# Patient Record
Sex: Male | Born: 1990 | Race: White | Hispanic: No | Marital: Single | Smoking: Current every day smoker
Health system: Southern US, Community
[De-identification: ages and names within clinical notes are randomized; demographics above are authoritative.]

---
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
## 2012-07-06 ENCOUNTER — Emergency Department: Payer: Self-pay | Admitting: Emergency Medicine

## 2012-10-31 ENCOUNTER — Emergency Department: Payer: Self-pay | Admitting: Emergency Medicine

## 2013-04-29 ENCOUNTER — Emergency Department: Payer: Self-pay | Admitting: Internal Medicine

## 2013-05-01 ENCOUNTER — Emergency Department: Payer: Self-pay | Admitting: Emergency Medicine

## 2013-08-15 IMAGING — CR DG CHEST 2V
1 series · 2 of 2 positions shown · non-contrast
Comparison: none

REASON FOR EXAM: chest pain
COMMENTS:   May transport without cardiac monitor

PROCEDURE:     DXR - DXR CHEST PA (OR AP) AND LATERAL  - October 17, 2011  [DATE]
RESULT:     Comparison: None

[Series 1: pa · 0.17mm/px · 2 of 2 slices shown]
[im 1/2]
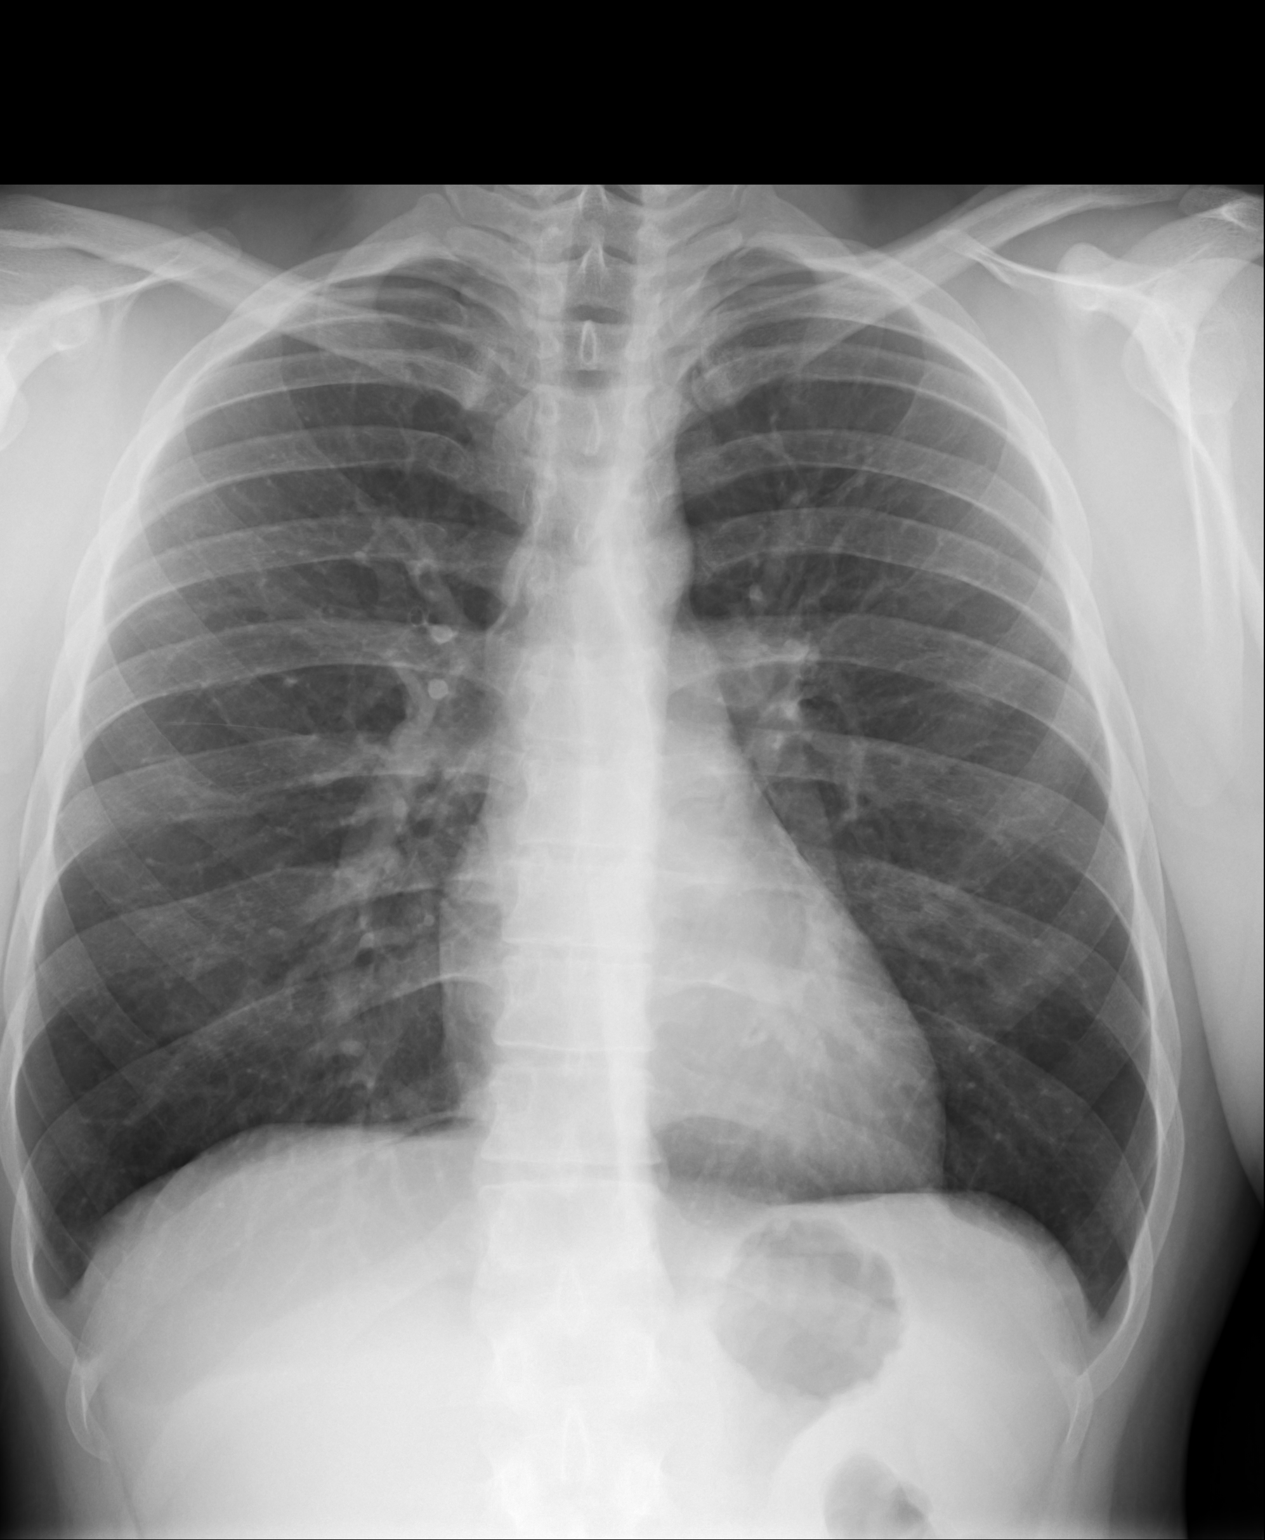
[im 2/2]
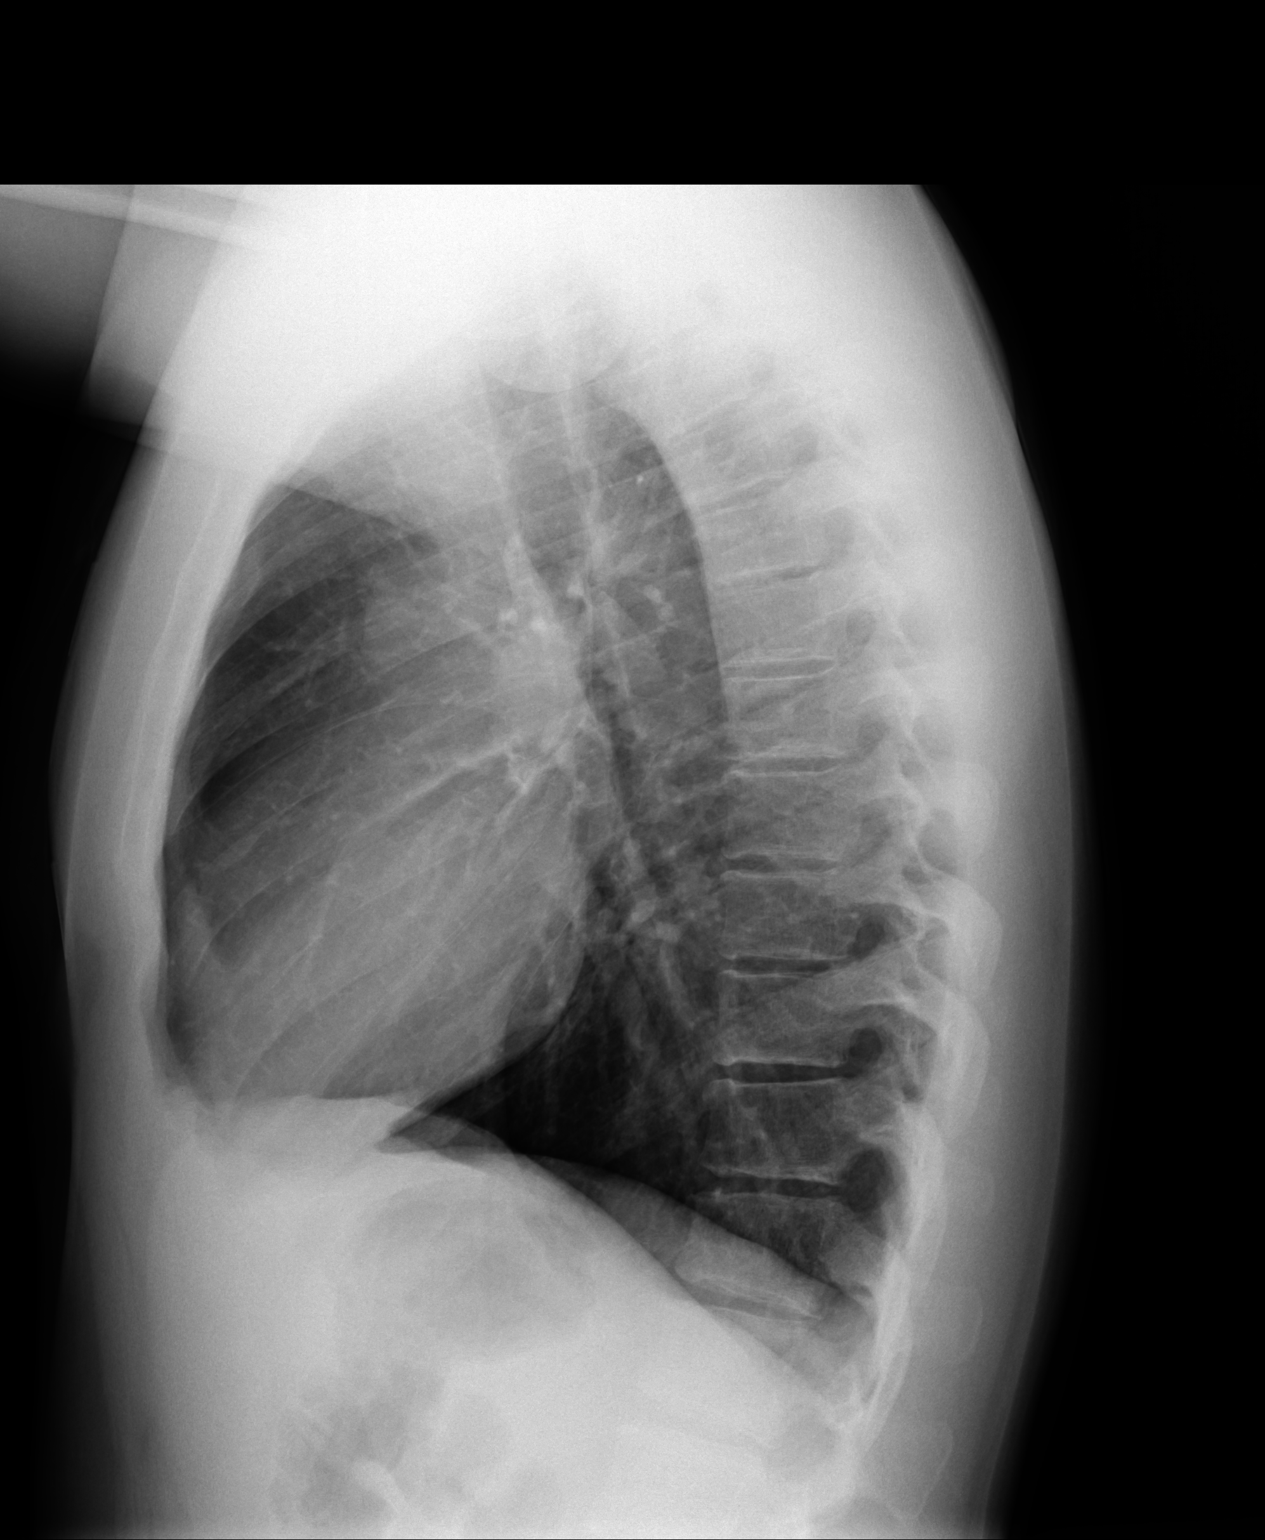

[2 of 2 positions shown; findings below may reference images not displayed]

FINDINGS: PA and lateral chest radiographs are provided.  There is no focal
parenchymal opacity, pleural effusion, or pneumothorax. The heart and
mediastinum are unremarkable.  The osseous structures are unremarkable.
IMPRESSION: No acute disease of the che[REDACTED]

## 2014-02-17 ENCOUNTER — Emergency Department (HOSPITAL_COMMUNITY)
Admission: EM | Admit: 2014-02-17 | Discharge: 2014-02-17 | Disposition: A | Discharge status: Home / Self Care | Payer: Medicaid Other | Attending: Emergency Medicine | Admitting: Emergency Medicine

## 2014-02-17 ENCOUNTER — Encounter (HOSPITAL_COMMUNITY): Payer: Self-pay | Admitting: Cardiology

## 2014-02-17 DIAGNOSIS — Z720 Tobacco use: Secondary | ICD-10-CM | POA: Insufficient documentation

## 2014-02-17 DIAGNOSIS — K0889 Other specified disorders of teeth and supporting structures: Secondary | ICD-10-CM

## 2014-02-17 DIAGNOSIS — K088 Other specified disorders of teeth and supporting structures: Secondary | ICD-10-CM | POA: Diagnosis not present

## 2014-02-17 MED ORDER — HYDROCODONE-ACETAMINOPHEN 5-325 MG PO TABS: 1.0000 | ORAL_TABLET | Freq: Once | ORAL | Status: DC

## 2014-02-17 MED ORDER — IBUPROFEN 600 MG PO TABS: 600.0000 mg | ORAL_TABLET | Freq: Four times a day (QID) | ORAL | Status: AC | PRN

## 2014-02-17 MED ORDER — AMOXICILLIN 500 MG PO CAPS: 500.0000 mg | ORAL_CAPSULE | Freq: Three times a day (TID) | ORAL | Status: AC

## 2014-02-17 MED ORDER — HYDROCODONE-ACETAMINOPHEN 5-325 MG PO TABS: 1.0000 | ORAL_TABLET | Freq: Four times a day (QID) | ORAL | Status: AC | PRN

## 2014-02-17 MED ORDER — OXYCODONE-ACETAMINOPHEN 5-325 MG PO TABS
1.0000 | ORAL_TABLET | Freq: Once | ORAL | Status: AC
  Administered 2014-02-17: 1 via ORAL
  Filled 2014-02-17: qty 1

## 2014-02-17 NOTE — Discharge Instructions (Signed)
Ibuprofen for pain, norco for severe pain. Follow up with a dentist as referred.    Dental Pain A tooth ache may be caused by cavities (tooth decay). Cavities expose the nerve of the tooth to air and hot or cold temperatures. It may come from an infection or abscess (also called a boil or furuncle) around your tooth. It is also often caused by dental caries (tooth decay). This causes the pain you are having. DIAGNOSIS  Your caregiver can diagnose this problem by exam. TREATMENT   If caused by an infection, it may be treated with medications which kill germs (antibiotics) and pain medications as prescribed by your caregiver. Take medications as directed.  Only take over-the-counter or prescription medicines for pain, discomfort, or fever as directed by your caregiver.  Whether the tooth ache today is caused by infection or dental disease, you should see your dentist as soon as possible for further care. SEEK MEDICAL CARE IF: The exam and treatment you received today has been provided on an emergency basis only. This is not a substitute for complete medical or dental care. If your problem worsens or new problems (symptoms) appear, and you are unable to meet with your dentist, call or return to this location. SEEK IMMEDIATE MEDICAL CARE IF:   You have a fever.  You develop redness and swelling of your face, jaw, or neck.  You are unable to open your mouth.  You have severe pain uncontrolled by pain medicine. MAKE SURE YOU:   Understand these instructions.  Will watch your condition.  Will get help right away if you are not doing well or get worse. Document Released: 03/30/2005 Document Revised: 06/22/2011 Document Reviewed: 11/16/2007 [facility] Patient Information 2015 [place], [place]. This information is not intended to replace advice given to you by your health care provider. Make sure you discuss any questions you have with your health care provider.

## 2014-02-17 NOTE — ED Notes (Signed)
Pt reports right sided tooth pain for the past week. Reports he thinks he needs his wisdom teeth removed.

## 2014-02-17 NOTE — ED Provider Notes (Signed)
CSN: [id number]     Arrival date & time 02/17/14  1626 History  This chart was scribed for non-physician practitioner, Jaynie Crumbleatyana Shontavia Mickel, PA-C,working with Gerhard Munchobert Lockwood, MD, by Karle PlumberJennifer Tensley, ED Scribe. This patient was seen in room TR11C/TR11C and the patient's care was started at 5:13 PM.  Chief Complaint  Patient presents with  . Dental Pain   Patient is a 23 y.o. male presenting with tooth pain. The history is provided by the patient. No language interpreter was used.  Dental Pain Associated symptoms: no facial swelling and no fever     HPI Comments:  Albert Torres is a 23 y.o. male who presents to the Emergency Department complaining of severe lower right-sided dental pain that began about one week ago. He reports associated swelling of the surrounding gingiva. Pt states he has applied Orajel to the area with minimal relief of the pain. Eating makes the pain worse. Denies alleviating factors. Denies fever, chills, nausea, vomiting, facial swelling, difficulty swallowing, sore throat or otalgia. He does not have a dentist.  History reviewed. No pertinent past medical history. History reviewed. No pertinent past surgical history. History reviewed. No pertinent family history. History  Substance Use Topics  . Smoking status: Current Every Day Smoker  . Smokeless tobacco: Not on file  . Alcohol Use: Yes    Review of Systems  Constitutional: Negative for fever and chills.  HENT: Positive for dental problem. Negative for ear pain, facial swelling, sore throat and trouble swallowing.   Gastrointestinal: Negative for nausea and vomiting.    Allergies  Review of patient's allergies indicates no known allergies.  Home Medications   Prior to Admission medications   Not on File   Triage Vitals: BP 159/85 mmHg  Pulse 67  Temp(Src) 98.1 F (36.7 C)  Resp 16  SpO2 99% Physical Exam  Constitutional: He is oriented to person, place, and time. He appears well-developed and  well-nourished.  HENT:  Head: Normocephalic and atraumatic.  Good dentition, no obvious caries or abscesses right lower third molar erupting from under the skin, no surrounding gum inflammation. Tooth is tender to palpation. No swelling noted to tongue, no trismus, no facial swelling  Eyes: EOM are normal.  Neck: Normal range of motion.  Cardiovascular: Normal rate.   Pulmonary/Chest: Effort normal.  Musculoskeletal: Normal range of motion.  Neurological: He is alert and oriented to person, place, and time.  Skin: Skin is warm and dry.  Psychiatric: He has a normal mood and affect. His behavior is normal.  Nursing note and vitals reviewed.   ED Course  Procedures (including critical care time) DIAGNOSTIC STUDIES: Oxygen Saturation is 99% on RA, normal by my interpretation.   COORDINATION OF CARE: 5:18 PM- Will prescribe pain medication and antibiotic. Will refer to dentist. Pt verbalizes understanding and agrees to plan.  Medications - No data to display  Labs Review Labs Reviewed - No data to display  Imaging Review No results found.   EKG Interpretation None      MDM   Final diagnoses:  Pain, dental   Patient with dental pain, pain is in his right lower third molar, which is erupting from under the skin. It does not appear to be obviously infected, however cannot rule out early infection. We'll start antibiotics, pain medications, follow up with a dentist. It is alluded to angina, no other complaints.  Filed Vitals:   02/17/14 1634  BP: 159/85  Pulse: 67  Temp: 98.1 F (36.7 C)  Resp: 16  SpO2: 99%     I personally performed the services described in this documentation, which was scribed in my presence. The recorded information has been reviewed and is accurate.    Lottie Musselatyana A Kyleeann Cremeans, PA-C 02/17/14 2150  Gerhard Munchobert Lockwood, MD 02/18/14 0003

## 2014-02-17 NOTE — ED Notes (Signed)
Declined W/C at D/C and was escorted to lobby by RN. 

## 2014-04-19 ENCOUNTER — Encounter (HOSPITAL_COMMUNITY): Payer: Self-pay | Admitting: Cardiology

## 2014-04-19 ENCOUNTER — Emergency Department (HOSPITAL_COMMUNITY)
Admission: EM | Admit: 2014-04-19 | Discharge: 2014-04-19 | Disposition: A | Discharge status: Home / Self Care | Payer: Medicaid Other | Attending: Emergency Medicine | Admitting: Emergency Medicine

## 2014-04-19 ENCOUNTER — Emergency Department (HOSPITAL_COMMUNITY): Payer: Medicaid Other

## 2014-04-19 DIAGNOSIS — Y998 Other external cause status: Secondary | ICD-10-CM | POA: Insufficient documentation

## 2014-04-19 DIAGNOSIS — Y9289 Other specified places as the place of occurrence of the external cause: Secondary | ICD-10-CM | POA: Diagnosis not present

## 2014-04-19 DIAGNOSIS — S4991XA Unspecified injury of right shoulder and upper arm, initial encounter: Secondary | ICD-10-CM | POA: Diagnosis present

## 2014-04-19 DIAGNOSIS — W010XXA Fall on same level from slipping, tripping and stumbling without subsequent striking against object, initial encounter: Secondary | ICD-10-CM | POA: Diagnosis not present

## 2014-04-19 DIAGNOSIS — Z720 Tobacco use: Secondary | ICD-10-CM | POA: Insufficient documentation

## 2014-04-19 DIAGNOSIS — Y9389 Activity, other specified: Secondary | ICD-10-CM | POA: Insufficient documentation

## 2014-04-19 DIAGNOSIS — I10 Essential (primary) hypertension: Secondary | ICD-10-CM

## 2014-04-19 DIAGNOSIS — Z792 Long term (current) use of antibiotics: Secondary | ICD-10-CM | POA: Insufficient documentation

## 2014-04-19 MED ORDER — OXYCODONE-ACETAMINOPHEN 5-325 MG PO TABS: 1.0000 | ORAL_TABLET | ORAL | Status: AC | PRN

## 2014-04-19 MED ORDER — NAPROXEN 500 MG PO TABS: 500.0000 mg | ORAL_TABLET | Freq: Two times a day (BID) | ORAL | Status: DC

## 2014-04-19 MED ORDER — OXYCODONE-ACETAMINOPHEN 5-325 MG PO TABS: 1.0000 | ORAL_TABLET | ORAL | Status: DC | PRN

## 2014-04-19 MED ORDER — OXYCODONE-ACETAMINOPHEN 5-325 MG PO TABS
2.0000 | ORAL_TABLET | Freq: Once | ORAL | Status: AC
  Administered 2014-04-19: 2 via ORAL
  Filled 2014-04-19: qty 2

## 2014-04-19 NOTE — Discharge Instructions (Signed)
Hypertension °Hypertension, commonly called high blood pressure, is when the force of blood pumping through your arteries is too strong. Your arteries are the blood vessels that carry blood from your heart throughout your body. A blood pressure reading consists of a higher number over a lower number, such as 110/72. The higher number (systolic) is the pressure inside your arteries when your heart pumps. The lower number (diastolic) is the pressure inside your arteries when your heart relaxes. Ideally you want your blood pressure below 120/80. °Hypertension forces your heart to work harder to pump blood. Your arteries may become narrow or stiff. Having hypertension puts you at risk for heart disease, stroke, and other problems.  °RISK FACTORS °Some risk factors for high blood pressure are controllable. Others are not.  °Risk factors you cannot control include:  °· Race. You may be at higher risk if you are African American. °· Age. Risk increases with age. °· Gender. Men are at higher risk than women before age 45 years. After age 65, women are at higher risk than men. °Risk factors you can control include: °· Not getting enough exercise or physical activity. °· Being overweight. °· Getting too much fat, sugar, calories, or salt in your diet. °· Drinking too much alcohol. °SIGNS AND SYMPTOMS °Hypertension does not usually cause signs or symptoms. Extremely high blood pressure (hypertensive crisis) may cause headache, anxiety, shortness of breath, and nosebleed. °DIAGNOSIS  °To check if you have hypertension, your health care provider will measure your blood pressure while you are seated, with your arm held at the level of your heart. It should be measured at least twice using the same arm. Certain conditions can cause a difference in blood pressure between your right and left arms. A blood pressure reading that is higher than normal on one occasion does not mean that you need treatment. If one blood pressure reading  is high, ask your health care provider about having it checked again. °TREATMENT  °Treating high blood pressure includes making lifestyle changes and possibly taking medicine. Living a healthy lifestyle can help lower high blood pressure. You may need to change some of your habits. °Lifestyle changes may include: °· Following the DASH diet. This diet is high in fruits, vegetables, and whole grains. It is low in salt, red meat, and added sugars. °· Getting at least 2½ hours of brisk physical activity every week. °· Losing weight if necessary. °· Not smoking. °· Limiting alcoholic beverages. °· Learning ways to reduce stress. ° If lifestyle changes are not enough to get your blood pressure under control, your health care provider may prescribe medicine. You may need to take more than one. Work closely with your health care provider to understand the risks and benefits. °HOME CARE INSTRUCTIONS °· Have your blood pressure rechecked as directed by your health care provider.   °· Take medicines only as directed by your health care provider. Follow the directions carefully. Blood pressure medicines must be taken as prescribed. The medicine does not work as well when you skip doses. Skipping doses also puts you at risk for problems.   °· Do not smoke.   °· Monitor your blood pressure at home as directed by your health care provider.  °SEEK MEDICAL CARE IF:  °· You think you are having a reaction to medicines taken. °· You have recurrent headaches or feel dizzy. °· You have swelling in your ankles. °· You have trouble with your vision. °SEEK IMMEDIATE MEDICAL CARE IF: °· You develop a severe headache or confusion. °·   You have unusual weakness, numbness, or feel faint. °· You have severe chest or abdominal pain. °· You vomit repeatedly. °· You have trouble breathing. °MAKE SURE YOU:  °· Understand these instructions. °· Will watch your condition. °· Will get help right away if you are not doing well or get worse. °Document  Released: 03/30/2005 Document Revised: 08/14/2013 Document Reviewed: 01/20/2013 °ExitCare® Patient Information ©2015 [employer]. This information is not intended to replace advice given to you by your health care provider. Make sure you discuss any questions you have with your health care provider. ° °Managing Your High Blood Pressure °Blood pressure is a measurement of how forceful your blood is pressing against the walls of the arteries. Arteries are muscular tubes within the circulatory system. Blood pressure does not stay the same. Blood pressure rises when you are active, excited, or nervous; and it lowers during sleep and relaxation. If the numbers measuring your blood pressure stay above normal most of the time, you are at risk for health problems. High blood pressure (hypertension) is a long-term (chronic) condition in which blood pressure is elevated. °A blood pressure reading is recorded as two numbers, such as 120 over 80 (or 120/80). The first, higher number is called the systolic pressure. It is a measure of the pressure in your arteries as the heart beats. The second, lower number is called the diastolic pressure. It is a measure of the pressure in your arteries as the heart relaxes between beats.  °Keeping your blood pressure in a normal range is important to your overall health and prevention of health problems, such as heart disease and stroke. When your blood pressure is uncontrolled, your heart has to work harder than normal. High blood pressure is a very common condition in adults because blood pressure tends to rise with age. Men and women are equally likely to have hypertension but at different times in life. Before age 45, men are more likely to have hypertension. After 22 years of age, women are more likely to have it. Hypertension is especially common in African Americans. This condition often has no signs or symptoms. The cause of the condition is usually not known. Your caregiver can  help you come up with a plan to keep your blood pressure in a normal, healthy range. °BLOOD PRESSURE STAGES °Blood pressure is classified into four stages: normal, prehypertension, stage 1, and stage 2. Your blood pressure reading will be used to determine what type of treatment, if any, is necessary. Appropriate treatment options are tied to these four stages:  °Normal °· Systolic pressure (mm Hg): below 120. °· Diastolic pressure (mm Hg): below 80. °Prehypertension °· Systolic pressure (mm Hg): 120 to 139. °· Diastolic pressure (mm Hg): 80 to 89. °Stage 1 °· Systolic pressure (mm Hg): 140 to 159. °· Diastolic pressure (mm Hg): 90 to 99. °Stage 2 °· Systolic pressure (mm Hg): 160 or above. °· Diastolic pressure (mm Hg): 100 or above. °RISKS RELATED TO HIGH BLOOD PRESSURE °Managing your blood pressure is an important responsibility. Uncontrolled high blood pressure can lead to: °· A heart attack. °· A stroke. °· A weakened blood vessel (aneurysm). °· Heart failure. °· Kidney damage. °· Eye damage. °· Metabolic syndrome. °· Memory and concentration problems. °HOW TO MANAGE YOUR BLOOD PRESSURE °Blood pressure can be managed effectively with lifestyle changes and medicines (if needed). Your caregiver will help you come up with a plan to bring your blood pressure within a normal range. Your plan should include the following: °Education °·   Read all information provided by your caregivers about how to control blood pressure. °· Educate yourself on the latest guidelines and treatment recommendations. New research is always being done to further define the risks and treatments for high blood pressure. °Lifestyle changes °· Control your weight. °· Avoid smoking. °· Stay physically active. °· Reduce the amount of salt in your diet. °· Reduce stress. °· Control any chronic conditions, such as high cholesterol or diabetes. °· Reduce your alcohol intake. °Medicines °· Several medicines (antihypertensive medicines) are available,  if needed, to bring blood pressure within a normal range. °Communication °· Review all the medicines you take with your caregiver because there may be side effects or interactions. °· Talk with your caregiver about your diet, exercise habits, and other lifestyle factors that may be contributing to high blood pressure. °· See your caregiver regularly. Your caregiver can help you create and adjust your plan for managing high blood pressure. °RECOMMENDATIONS FOR TREATMENT AND FOLLOW-UP  °The following recommendations are based on current guidelines for managing high blood pressure in nonpregnant adults. Use these recommendations to identify the proper follow-up period or treatment option based on your blood pressure reading. You can discuss these options with your caregiver. °· Systolic pressure of 120 to 139 or diastolic pressure of 80 to 89: Follow up with your caregiver as directed. °· Systolic pressure of 140 to 160 or diastolic pressure of 90 to 100: Follow up with your caregiver within 2 months. °· Systolic pressure above 160 or diastolic pressure above 100: Follow up with your caregiver within 1 month. °· Systolic pressure above 180 or diastolic pressure above 110: Consider antihypertensive therapy; follow up with your caregiver within 1 week. °· Systolic pressure above 200 or diastolic pressure above 120: Begin antihypertensive therapy; follow up with your caregiver within 1 week. °Document Released: 12/23/2011 Document Reviewed: 12/23/2011 °ExitCare® Patient Information ©2015 [employer]. This information is not intended to replace advice given to you by your health care provider. Make sure you discuss any questions you have with your health care provider. ° °

## 2014-04-19 NOTE — ED Notes (Signed)
Patient transported to X-ray 

## 2014-04-19 NOTE — ED Notes (Signed)
Pt reports that he slipped and fell last night onto his right shoulder. States that he woke up shaking this morning because of the pain.

## 2014-04-19 NOTE — ED Provider Notes (Signed)
CSN: [id number]     Arrival date & time 04/19/14  1234 History  This chart was scribed for non-physician practitioner, Arthor Captain, PA-C, working with No att. providers found by Charline Bills, ED Scribe. This patient was seen in room TR07C/TR07C and the patient's care was started at 3:58 PM.   Chief Complaint  Patient presents with  . Shoulder Pain   The history is provided by the patient. No language interpreter was used.   HPI Comments: Albert Torres is a 24 y.o. male who presents to the Emergency Department complaining of gradual onset of constant R shoulder pain present since this morning. Pt reports a slip and fall last night in which he landed on his R shoulder. Pain is exacerbated with movement and lifting. He states that the pain is so severe that it caused him to shake this morning. Pt is ambidextrous.   History reviewed. No pertinent past medical history. History reviewed. No pertinent past surgical history. History reviewed. No pertinent family history. History  Substance Use Topics  . Smoking status: Current Every Day Smoker    Types: Cigarettes  . Smokeless tobacco: Not on file  . Alcohol Use: Yes    Review of Systems  Musculoskeletal: Positive for arthralgias.   Allergies  Review of patient's allergies indicates no known allergies.  Home Medications   Prior to Admission medications   Medication Sig Start Date End Date Taking? Authorizing Provider  amoxicillin (AMOXIL) 500 MG capsule Take 1 capsule (500 mg total) by mouth 3 (three) times daily. 02/17/14   Tatyana A Kirichenko, PA-C  HYDROcodone-acetaminophen (NORCO) 5-325 MG per tablet Take 1 tablet by mouth every 6 (six) hours as needed for moderate pain. 02/17/14   Tatyana A Kirichenko, PA-C  ibuprofen (ADVIL,MOTRIN) 600 MG tablet Take 1 tablet (600 mg total) by mouth every 6 (six) hours as needed. 02/17/14   Tatyana A Kirichenko, PA-C   BP 160/95 mmHg  Pulse 84  Temp(Src) 98.4 F (36.9 C) (Oral)  Resp 18  Ht 5'  7" (1.702 m)  Wt 165 lb (74.844 kg)  BMI 25.84 kg/m2  SpO2 100% Physical Exam  Constitutional: He is oriented to person, place, and time. He appears well-developed and well-nourished. No distress.  HENT:  Head: Normocephalic and atraumatic.  Eyes: Conjunctivae and EOM are normal.  Neck: Neck supple.  Cardiovascular: Normal rate.   Pulmonary/Chest: Effort normal.  Musculoskeletal: Normal range of motion.  Neurological: He is alert and oriented to person, place, and time.  Skin: Skin is warm and dry.  Psychiatric: He has a normal mood and affect. His behavior is normal.  Nursing note and vitals reviewed.  ED Course  Procedures (including critical care time) DIAGNOSTIC STUDIES: Oxygen Saturation is 100% on RA, normal by my interpretation.    COORDINATION OF CARE: 4:04 PM-Discussed treatment plan which includes XR, Percocet, ice, sling and follow-up with orthopedist with pt at bedside and pt agreed to plan.   Labs Review Labs Reviewed - No data to display  Imaging Review Dg Shoulder Right  04/19/2014   CLINICAL DATA:  Status post fall yesterday.  Shoulder pain.  EXAM: RIGHT SHOULDER - 2+ VIEW  COMPARISON:  None.  FINDINGS: There is no evidence of fracture or dislocation. There is no evidence of arthropathy or other focal bone abnormality. Soft tissues are unremarkable.  IMPRESSION: Negative.   Electronically Signed   By: Elige Ko   On: 04/19/2014 14:08    EKG Interpretation None      MDM  Final diagnoses:  Shoulder injury, right, initial encounter  Essential hypertension    Patient X-Ray negative for obvious fracture or dislocation. Pain managed in ED. Pt advised to follow up with orthopedics if symptoms persist for possibility of missed fracture diagnosis. Patient given brace while in ED, conservative therapy recommended and discussed. Patient will be dc home & is agreeable with above plan.   I personally performed the services described in this documentation, which  was scribed in my presence. The recorded information has been reviewed and is accurate.    Arthor Captainbigail Madhav Mohon, PA-C 04/19/14 1915  Doug SouSam Jacubowitz, MD 04/20/14 [phone]

## 2014-04-19 NOTE — ED Notes (Signed)
Pt comfortable with discharge and follow up instructions. Pt declines wheelchair, escorted to waiting area by this RN. Prescriptions x2. 

## 2016-02-16 IMAGING — DX DG SHOULDER 2+V*R*
2 series · 2 of 2 positions shown · non-contrast
Comparison: None.

CLINICAL DATA: Status post fall yesterday.  Shoulder pain.

EXAM:
RIGHT SHOULDER - 2+ VIEW

[shoulder grashey]
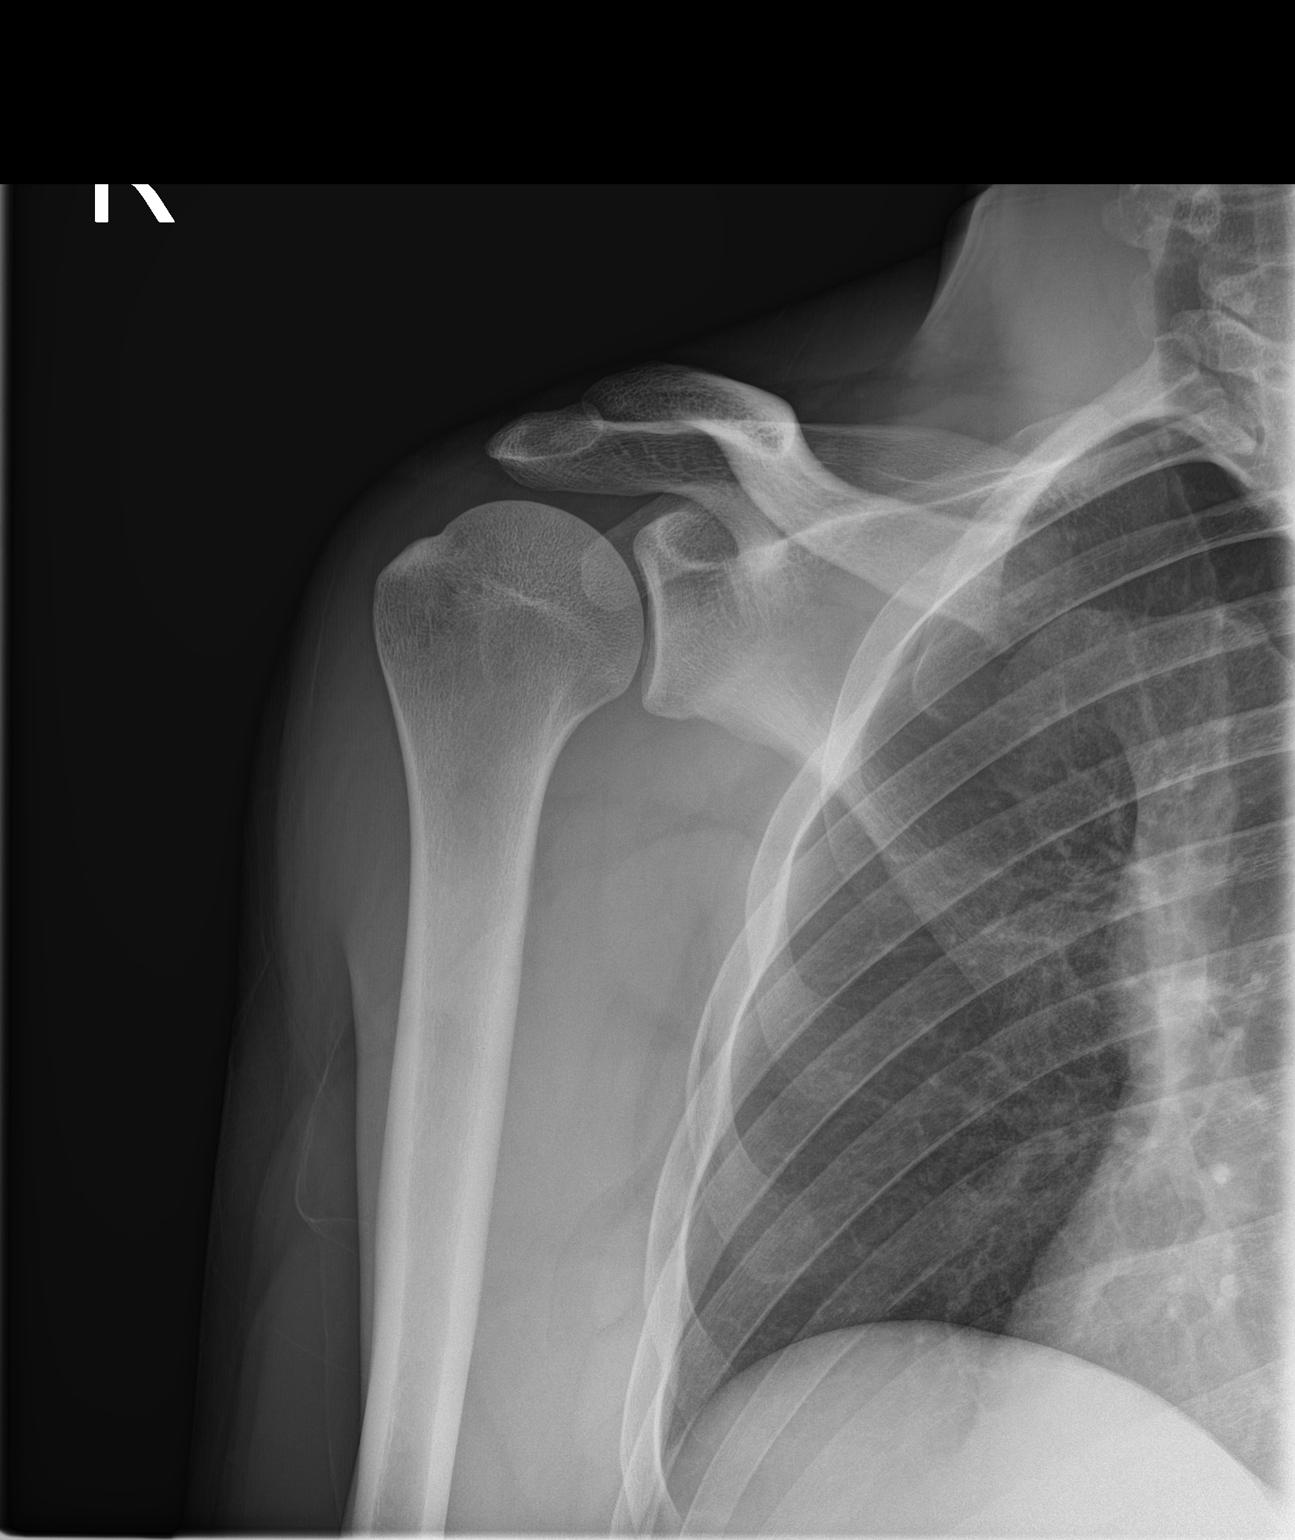

[shoulder y-view]
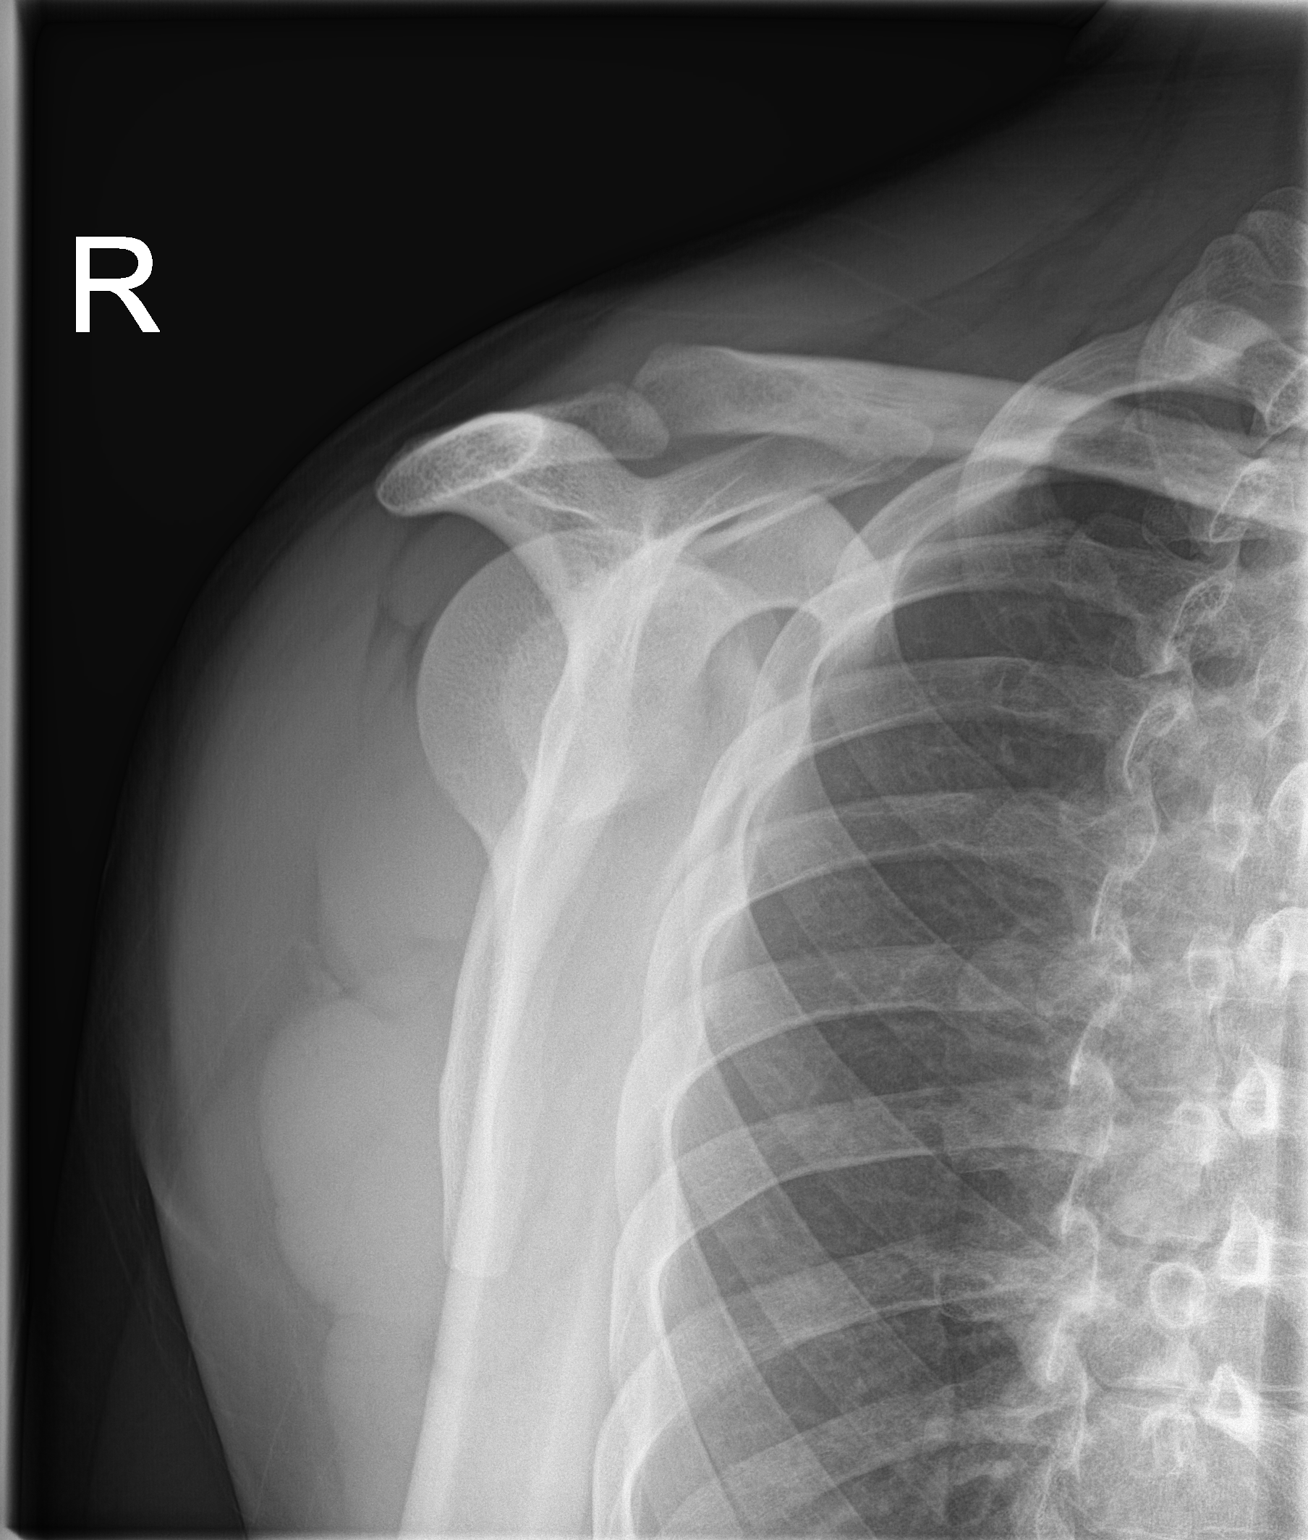

[2 of 2 positions shown; findings below may reference images not displayed]

FINDINGS: There is no evidence of fracture or dislocation. There is no
evidence of arthropathy or other focal bone abnormality. Soft
tissues are unremarkable.
IMPRESSION: Negative.

## 2018-02-20 ENCOUNTER — Encounter (HOSPITAL_COMMUNITY): Payer: Self-pay | Admitting: Pharmacy Technician

## 2018-02-20 ENCOUNTER — Other Ambulatory Visit: Payer: Self-pay

## 2018-02-20 ENCOUNTER — Emergency Department (HOSPITAL_COMMUNITY)
Admission: EM | Admit: 2018-02-20 | Discharge: 2018-02-20 | Disposition: A | Discharge status: Home / Self Care | Payer: Self-pay | Attending: Emergency Medicine | Admitting: Emergency Medicine

## 2018-02-20 ENCOUNTER — Emergency Department (HOSPITAL_COMMUNITY): Payer: Self-pay

## 2018-02-20 DIAGNOSIS — Y999 Unspecified external cause status: Secondary | ICD-10-CM | POA: Insufficient documentation

## 2018-02-20 DIAGNOSIS — W268XXA Contact with other sharp object(s), not elsewhere classified, initial encounter: Secondary | ICD-10-CM | POA: Insufficient documentation

## 2018-02-20 DIAGNOSIS — Z23 Encounter for immunization: Secondary | ICD-10-CM | POA: Insufficient documentation

## 2018-02-20 DIAGNOSIS — Y92008 Other place in unspecified non-institutional (private) residence as the place of occurrence of the external cause: Secondary | ICD-10-CM | POA: Insufficient documentation

## 2018-02-20 DIAGNOSIS — Y9389 Activity, other specified: Secondary | ICD-10-CM | POA: Insufficient documentation

## 2018-02-20 DIAGNOSIS — S91331A Puncture wound without foreign body, right foot, initial encounter: Secondary | ICD-10-CM | POA: Insufficient documentation

## 2018-02-20 DIAGNOSIS — F17210 Nicotine dependence, cigarettes, uncomplicated: Secondary | ICD-10-CM | POA: Insufficient documentation

## 2018-02-20 MED ORDER — OXYCODONE-ACETAMINOPHEN 5-325 MG PO TABS
1.0000 | ORAL_TABLET | Freq: Once | ORAL | Status: AC
  Administered 2018-02-20: 1 via ORAL
  Filled 2018-02-20: qty 1

## 2018-02-20 MED ORDER — CEPHALEXIN 500 MG PO CAPS: 500.0000 mg | ORAL_CAPSULE | Freq: Four times a day (QID) | ORAL | 0 refills | Status: AC

## 2018-02-20 MED ORDER — TETANUS-DIPHTH-ACELL PERTUSSIS 5-2.5-18.5 LF-MCG/0.5 IM SUSP
0.5000 mL | Freq: Once | INTRAMUSCULAR | Status: AC
  Administered 2018-02-20: 0.5 mL via INTRAMUSCULAR
  Filled 2018-02-20: qty 0.5

## 2018-02-20 MED ORDER — NAPROXEN 500 MG PO TABS: 500.0000 mg | ORAL_TABLET | Freq: Two times a day (BID) | ORAL | 0 refills | Status: AC

## 2018-02-20 MED ORDER — CIPROFLOXACIN HCL 750 MG PO TABS: 750.0000 mg | ORAL_TABLET | Freq: Two times a day (BID) | ORAL | 0 refills | Status: AC

## 2018-02-20 NOTE — ED Provider Notes (Signed)
[facility] EMERGENCY DEPARTMENT Provider Note   CSN: [id number] Arrival date & time: 02/20/18  1602     History   Chief Complaint Chief Complaint  Patient presents with  . Foot Pain    HPI Albert Torres is a 27 y.o. male who presents to ED for right foot pain after stepping on a nail prior to arrival.  States that he was working on his deck when he stepped on a board with a nail sticking out of it with his shoes on.  He pulled the board away.  He has had pain in the area since then.  Unsure of last tetanus.  HPI  History reviewed. No pertinent past medical history.  There are no active problems to display for this patient.   History reviewed. No pertinent surgical history.      Home Medications    Prior to Admission medications   Medication Sig Start Date End Date Taking? Authorizing Provider  amoxicillin (AMOXIL) 500 MG capsule Take 1 capsule (500 mg total) by mouth 3 (three) times daily. 02/17/14   Kirichenko, Tatyana, PA-C  cephALEXin (KEFLEX) 500 MG capsule Take 1 capsule (500 mg total) by mouth 4 (four) times daily. 02/20/18   Lawrance Wiedemann, PA-C  ciprofloxacin (CIPRO) 750 MG tablet Take 1 tablet (750 mg total) by mouth 2 (two) times daily for 7 days. 02/20/18 02/27/18  Kylar Leonhardt, Hillary Bow, PA-C  HYDROcodone-acetaminophen (NORCO) 5-325 MG per tablet Take 1 tablet by mouth every 6 (six) hours as needed for moderate pain. 02/17/14   Kirichenko, Tatyana, PA-C  ibuprofen (ADVIL,MOTRIN) 600 MG tablet Take 1 tablet (600 mg total) by mouth every 6 (six) hours as needed. 02/17/14   Kirichenko, Tatyana, PA-C  naproxen (NAPROSYN) 500 MG tablet Take 1 tablet (500 mg total) by mouth 2 (two) times daily. 02/20/18   Aronda Burford, PA-C  oxyCODONE-acetaminophen (ROXICET) 5-325 MG per tablet Take 1-2 tablets by mouth every 4 (four) hours as needed for severe pain. 04/19/14   Arthor Captain, PA-C    Family History No family history on file.  Social History Social History    Tobacco Use  . Smoking status: Current Every Day Smoker    Types: Cigarettes  Substance Use Topics  . Alcohol use: Yes  . Drug use: No     Allergies   Patient has no known allergies.   Review of Systems Review of Systems  Constitutional: Negative for chills and fever.  Skin: Positive for wound.  Neurological: Negative for weakness and numbness.     Physical Exam Updated Vital Signs BP 137/64 (BP Location: Right Arm)   Pulse 71   Temp 97.6 F (36.4 C) (Oral)   Resp 16   Ht 5\' 8"  (1.727 m)   Wt 81.6 kg   SpO2 100%   BMI 27.37 kg/m   Physical Exam  Constitutional: He appears well-developed and well-nourished. No distress.  HENT:  Head: Normocephalic and atraumatic.  Eyes: Conjunctivae and EOM are normal. No scleral icterus.  Neck: Normal range of motion.  Pulmonary/Chest: Effort normal. No respiratory distress.  Musculoskeletal:  Able to move digits of right foot without difficulty.  Sensation intact to light touch of foot.  2+ DP pulse palpated.  Neurological: He is alert.  Skin: No rash noted. He is not diaphoretic.  Small puncture wound noted to the bottom of the right foot.  No active bleeding or surrounding erythema noted.  Psychiatric: He has a normal mood and affect.  Nursing note and vitals reviewed.  ED Treatments / Results  Labs (all labs ordered are listed, but only abnormal results are displayed) Labs Reviewed - No data to display  EKG None  Radiology Dg Foot Complete Right  Result Date: 02/20/2018 CLINICAL DATA:  Nail in foot. EXAM: RIGHT FOOT COMPLETE - 3+ VIEW COMPARISON:  None. FINDINGS: There is no evidence of fracture or dislocation. There is no evidence of arthropathy or other focal bone abnormality. Soft tissues are unremarkable. IMPRESSION: Negative. Electronically Signed   By: Lupita Raider, M.D.   On: 02/20/2018 17:21    Procedures Procedures (including critical care time)  Medications Ordered in ED Medications   oxyCODONE-acetaminophen (PERCOCET/ROXICET) 5-325 MG per tablet 1 tablet (1 tablet Oral Given 02/20/18 1629)  Tdap (BOOSTRIX) injection 0.5 mL (0.5 mLs Intramuscular Given 02/20/18 1629)     Initial Impression / Assessment and Plan / ED Course  I have reviewed the triage vital signs and the nursing notes.  Pertinent labs & imaging results that were available during my care of the patient were reviewed by me and considered in my medical decision making (see chart for details).     27 year old male presents to ED for right foot puncture wound that occurred prior to arrival.  He was wearing his shoes working on his deck when he stepped on a nail.  He removed the nail.  He is unsure of last tetanus.  On exam there is a small puncture wound noted with no overlying skin changes.  No foreign bodies palpated.  Area was irrigated copiously.  X-rays negative.  Patient was updated on tetanus, will advise him to take ciprofloxacin and Keflex.  Patient educated on importance of taking antibiotics to prevent infection.  Advised to return to ED for any severe worsening symptoms.  Patient is hemodynamically stable, in NAD, and able to ambulate in the ED. Evaluation does not show pathology that would require ongoing emergent intervention or inpatient treatment. I explained the diagnosis to the patient. Pain has been managed and has no complaints prior to discharge. Patient is comfortable with above plan and is stable for discharge at this time. All questions were answered prior to disposition. Strict return precautions for returning to the ED were discussed. Encouraged follow up with PCP.    Portions of this note were generated with Scientist, clinical (histocompatibility and immunogenetics). Dictation errors may occur despite best attempts at proofreading.   Final Clinical Impressions(s) / ED Diagnoses   Final diagnoses:  Puncture wound of right foot, initial encounter    ED Discharge Orders         Ordered    ciprofloxacin (CIPRO) 750  MG tablet  2 times daily     02/20/18 1803    cephALEXin (KEFLEX) 500 MG capsule  4 times daily     02/20/18 1803    naproxen (NAPROSYN) 500 MG tablet  2 times daily     02/20/18 1803           Dietrich Pates, PA-C 02/20/18 1805    Tegeler, Canary Brim, MD 02/20/18 1949

## 2018-02-20 NOTE — Discharge Instructions (Signed)
Take both of the antibiotics as directed.  Please complete the entire course of medication to prevent a severe foot infection from your puncture wound. Return to ED for worsening symptoms, increased swelling or redness, pus draining from the area, fevers.

## 2018-02-20 NOTE — ED Triage Notes (Signed)
Pt arrives pov with reports of R foot pain after having to pull a nail out of the bottom of his foot. CMS intact.

## 2019-12-20 IMAGING — DX DG FOOT COMPLETE 3+V*R*
3 series · 3 of 3 positions shown · non-contrast
Comparison: None.

CLINICAL DATA: Nail in foot.

EXAM:
RIGHT FOOT COMPLETE - 3+ VIEW

[foot ap]
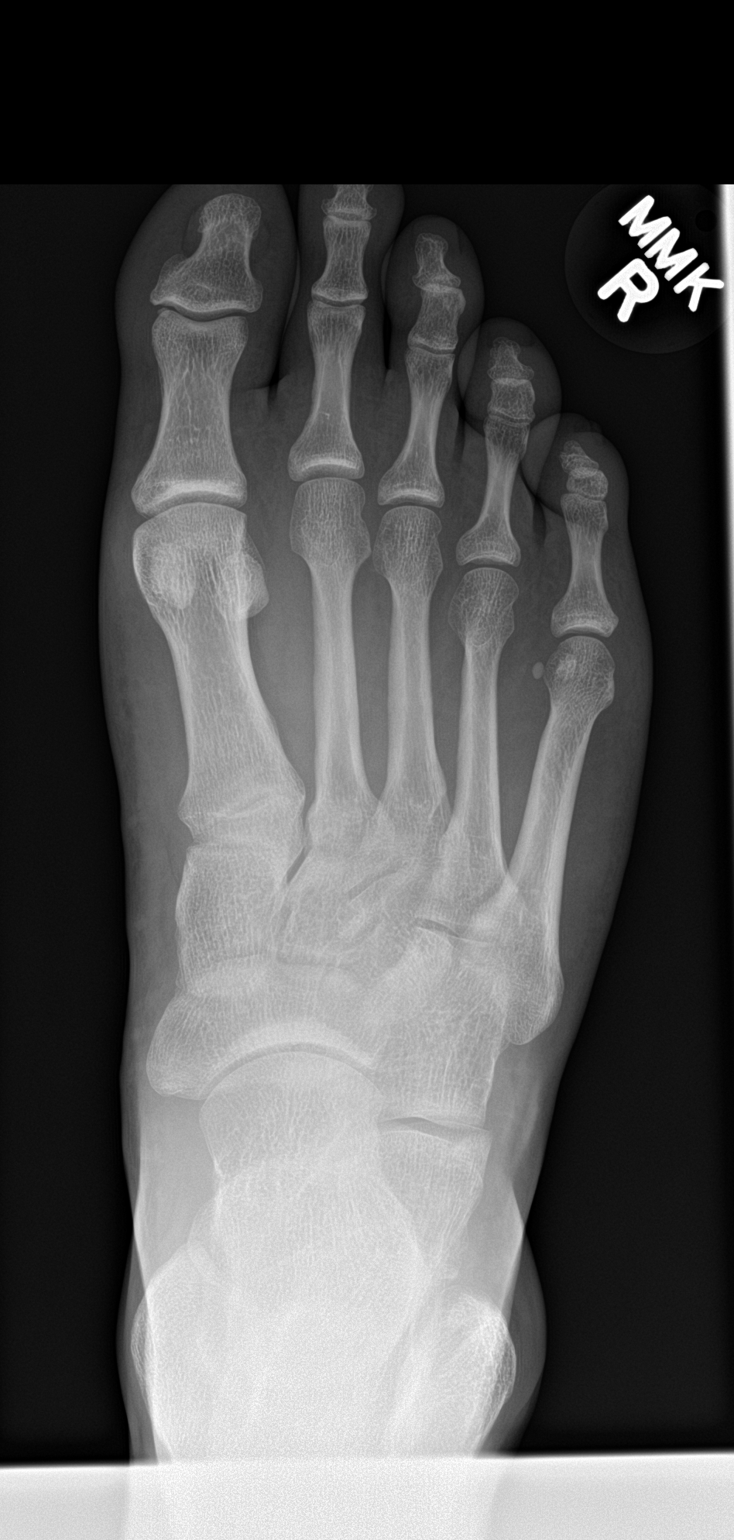

[foot obl]
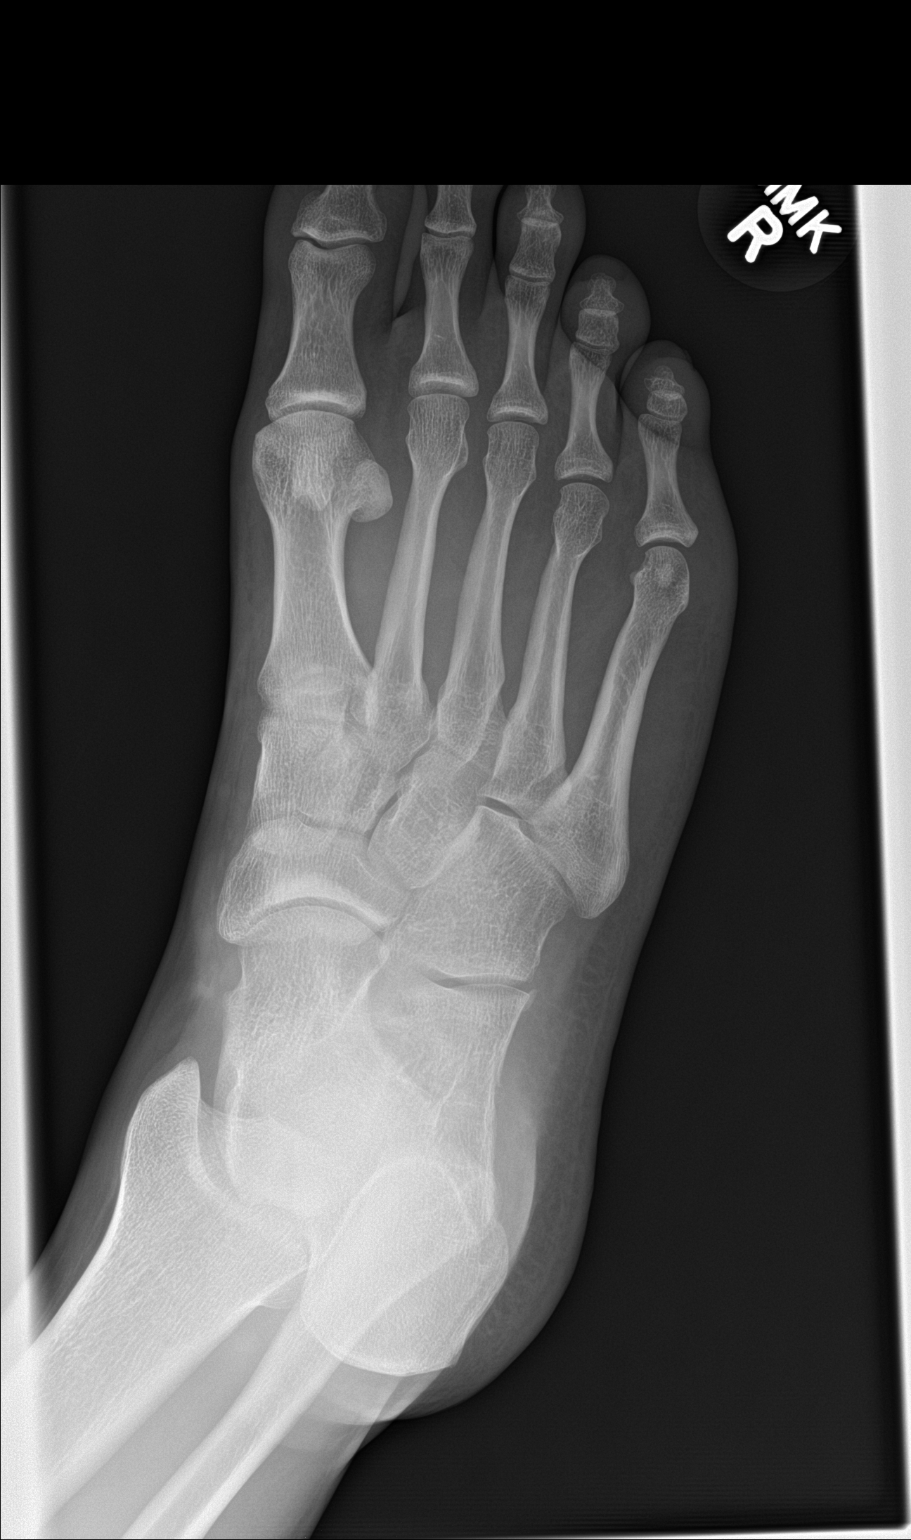

[foot lat]
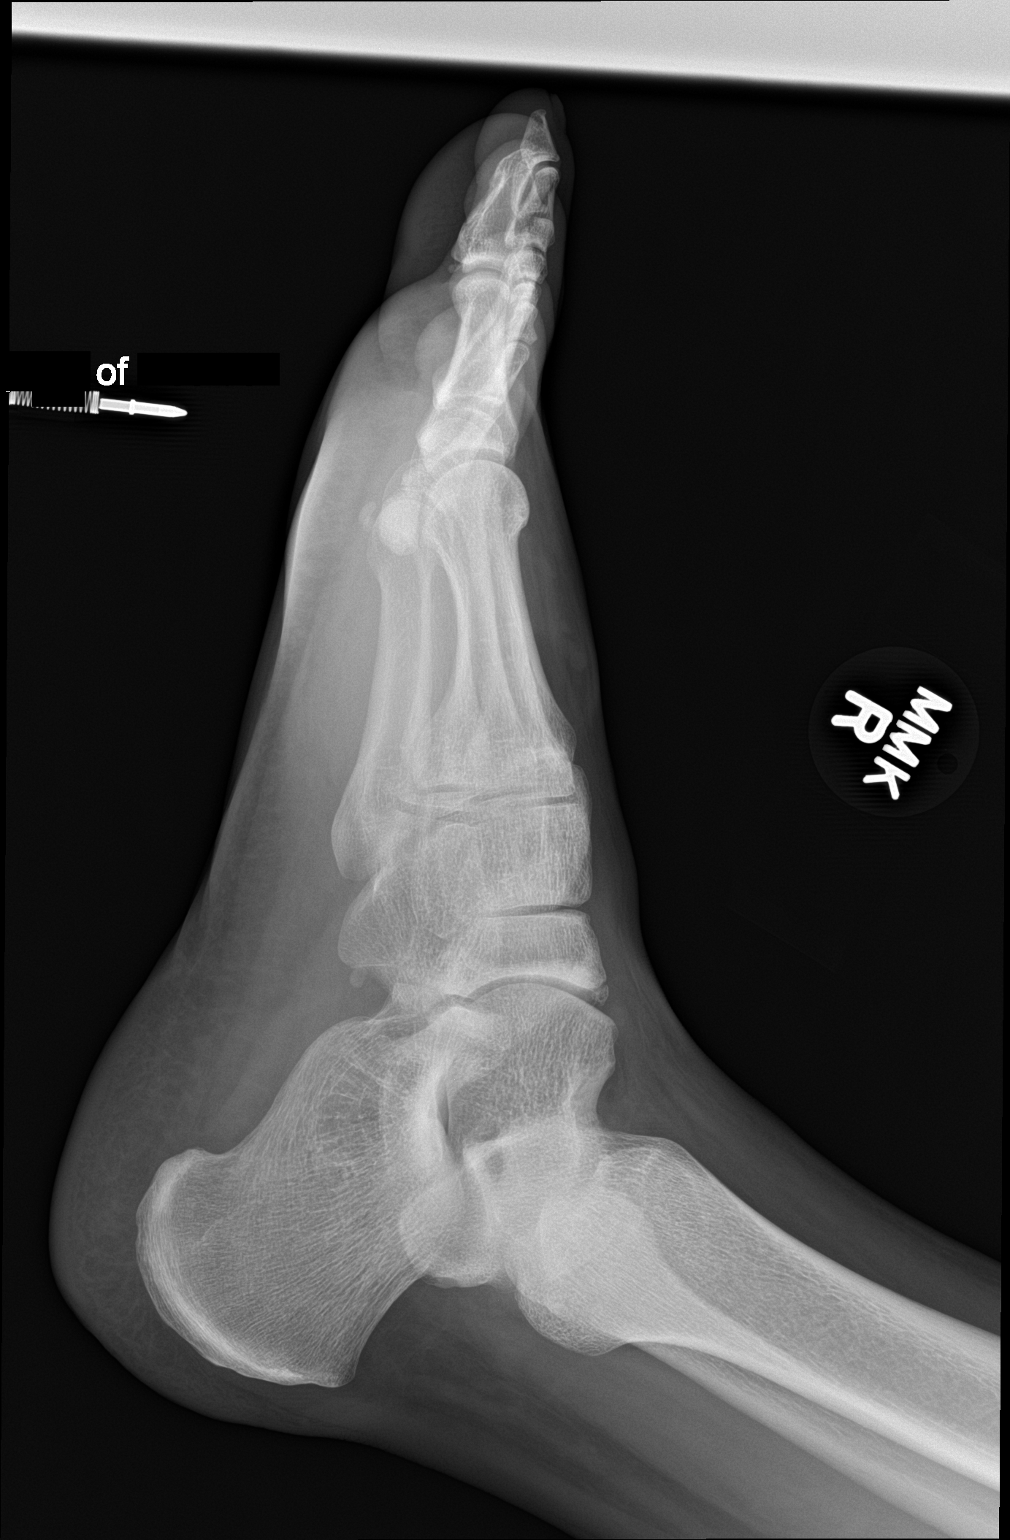

[3 of 3 positions shown; findings below may reference images not displayed]

FINDINGS: There is no evidence of fracture or dislocation. There is no
evidence of arthropathy or other focal bone abnormality. Soft
tissues are unremarkable.
IMPRESSION: Negative.
